# Patient Record
Sex: Female | Born: 1990 | Race: White | Hispanic: No | Marital: Married | Smoking: Never smoker
Health system: Southern US, Community
[De-identification: ages and names within clinical notes are randomized; demographics above are authoritative.]

## PROBLEM LIST (undated history)

## (undated) ENCOUNTER — Inpatient Hospital Stay (HOSPITAL_COMMUNITY): Payer: Self-pay

## (undated) DIAGNOSIS — M549 Dorsalgia, unspecified: Secondary | ICD-10-CM

## (undated) HISTORY — DX: Dorsalgia, unspecified: M54.9

---

## 2012-01-23 ENCOUNTER — Encounter (HOSPITAL_COMMUNITY): Payer: Self-pay | Admitting: *Deleted

## 2012-01-23 ENCOUNTER — Inpatient Hospital Stay (HOSPITAL_COMMUNITY)
Admission: AD | Admit: 2012-01-23 | Discharge: 2012-01-24 | Disposition: A | Discharge status: Home / Self Care | Payer: Medicaid Other | Source: Ambulatory Visit | Attending: Obstetrics & Gynecology | Admitting: Obstetrics & Gynecology

## 2012-01-23 DIAGNOSIS — M545 Low back pain, unspecified: Secondary | ICD-10-CM | POA: Insufficient documentation

## 2012-01-23 DIAGNOSIS — N949 Unspecified condition associated with female genital organs and menstrual cycle: Secondary | ICD-10-CM

## 2012-01-23 DIAGNOSIS — R109 Unspecified abdominal pain: Secondary | ICD-10-CM | POA: Insufficient documentation

## 2012-01-23 DIAGNOSIS — Z3490 Encounter for supervision of normal pregnancy, unspecified, unspecified trimester: Secondary | ICD-10-CM

## 2012-01-23 DIAGNOSIS — O368190 Decreased fetal movements, unspecified trimester, not applicable or unspecified: Secondary | ICD-10-CM | POA: Insufficient documentation

## 2012-01-23 DIAGNOSIS — O99891 Other specified diseases and conditions complicating pregnancy: Secondary | ICD-10-CM | POA: Insufficient documentation

## 2012-01-23 LAB — URINALYSIS, ROUTINE W REFLEX MICROSCOPIC
Bilirubin Urine: NEGATIVE
Glucose, UA: NEGATIVE mg/dL
Hgb urine dipstick: NEGATIVE
Ketones, ur: NEGATIVE mg/dL
Leukocytes, UA: NEGATIVE
Nitrite: NEGATIVE
Protein, ur: NEGATIVE mg/dL
Specific Gravity, Urine: <1.005 — ABNORMAL LOW (ref 1.005–1.030)
Urobilinogen, UA: 0.2 mg/dL (ref 0.0–1.0)
pH: 6 (ref 5.0–8.0)

## 2012-01-23 LAB — WET PREP, GENITAL
Clue Cells Wet Prep HPF POC: NONE SEEN
Trich, Wet Prep: NONE SEEN
WBC, Wet Prep HPF POC: NONE SEEN
Yeast Wet Prep HPF POC: NONE SEEN

## 2012-01-23 NOTE — MAU Note (Signed)
Pt LMP in July, no prenatal care, waiting for medicaid.  Last week had severe cramping and back pain, no bleeding.  Now abdomen very tender.

## 2012-01-23 NOTE — MAU Provider Note (Signed)
History     CSN: [id number]  Arrival date and time: 01/23/12 2058   None     Chief Complaint  Patient presents with  . Abdominal Pain   HPIHannah Espinoza is 21 y.o. G1P0 [redacted]w[redacted]d weeks presenting with 1 week history with strong lower back pain that wrapped around her lower abdomen.  She states she is exhausted and hasn't felt the baby move in 1 week.  Denies vaginal bleeding.  Has a yellow discharge.  Waiting for medicaid paper work to begin prenatal care.    History reviewed. No pertinent past medical history.  History reviewed. No pertinent past surgical history.  Family History  Problem Relation Age of Onset  . Hypertension Father     History  Substance Use Topics  . Smoking status: Never Smoker   . Smokeless tobacco: Never Used  . Alcohol Use: No    Allergies: No Known Allergies  Prescriptions prior to admission  Medication Sig Dispense Refill  . Prenatal Vit-Fe Fumarate-FA (PRENATAL MULTIVITAMIN) TABS Take by mouth.        Review of Systems  Gastrointestinal: Positive for abdominal pain (lower on her sides).  Genitourinary: Negative for dysuria and urgency.       Denies vaginal bleeding. + for vaginal discharge  Musculoskeletal: Positive for back pain (lower).   Physical Exam   Blood pressure 121/75, pulse 87, temperature 98 F (36.7 C), temperature source Oral, resp. rate 16, height 5\' 4"  (1.626 m), weight 130 lb 9.6 oz (59.24 kg), last menstrual period 09/03/2011.  Physical Exam  Constitutional: She is oriented to person, place, and time. She appears well-developed and well-nourished. No distress.  HENT:  Head: Normocephalic.  Neck: Normal range of motion.  Cardiovascular: Normal rate.   Respiratory: Effort normal.  GI: Soft. She exhibits no distension and no mass. There is tenderness (tenderness in the area of the round ligaments). There is no rebound and no guarding.  Genitourinary: There is no tenderness or lesion on the right labia. There is no  tenderness or lesion on the left labia. Uterus is enlarged (17 weeks size). Uterus is not tender. Cervix exhibits no motion tenderness, no discharge and no friability. No bleeding around the vagina. Vaginal discharge (thin discharge with mild odor) found.  Neurological: She is oriented to person, place, and time.  Skin: Skin is warm and dry.  Psychiatric: She has a normal mood and affect. Her behavior is normal.   Results for orders placed during the hospital encounter of 01/23/12 (from the past 24 hour(s))  URINALYSIS, ROUTINE W REFLEX MICROSCOPIC     Status: Abnormal   Collection Time   01/23/12  9:40 PM      Component Value Range   Color, Urine YELLOW  YELLOW   APPearance CLEAR  CLEAR   Specific Gravity, Urine <1.005 (*) 1.005 - 1.030   pH 6.0  5.0 - 8.0   Glucose, UA NEGATIVE  NEGATIVE mg/dL   Hgb urine dipstick NEGATIVE  NEGATIVE   Bilirubin Urine NEGATIVE  NEGATIVE   Ketones, ur NEGATIVE  NEGATIVE mg/dL   Protein, ur NEGATIVE  NEGATIVE mg/dL   Urobilinogen, UA 0.2  0.0 - 1.0 mg/dL   Nitrite NEGATIVE  NEGATIVE   Leukocytes, UA NEGATIVE  NEGATIVE  WET PREP, GENITAL     Status: Normal   Collection Time   01/23/12 11:30 PM      Component Value Range   Yeast Wet Prep HPF POC NONE SEEN  NONE SEEN   Trich, Wet Prep  NONE SEEN  NONE SEEN   Clue Cells Wet Prep HPF POC NONE SEEN  NONE SEEN   WBC, Wet Prep HPF POC NONE SEEN  NONE SEEN   MAU Course  Procedures   GC/CHL culture to lab  MDM   Assessment and Plan  A:  Lower back and abdominal pain at 17 weeks pregnancy       + FHR of 151 by Doppler       Round ligament pain  P:  Begin prenatal care with doctor of your choice     May take tylenol for discomfort       KEY,EVE M 01/23/2012, 11:20 PM

## 2012-01-24 LAB — GC/CHLAMYDIA PROBE AMP: GC Probe RNA: NEGATIVE

## 2012-02-21 ENCOUNTER — Ambulatory Visit (INDEPENDENT_AMBULATORY_CARE_PROVIDER_SITE_OTHER): Payer: Medicaid Other | Admitting: Obstetrics and Gynecology

## 2012-02-21 DIAGNOSIS — Z331 Pregnant state, incidental: Secondary | ICD-10-CM

## 2012-02-21 DIAGNOSIS — Z3689 Encounter for other specified antenatal screening: Secondary | ICD-10-CM

## 2012-02-21 LAB — POCT URINALYSIS DIPSTICK
Bilirubin, UA: NEGATIVE
Blood, UA: NEGATIVE
Glucose, UA: NEGATIVE
Ketones, UA: NEGATIVE
Spec Grav, UA: 1.015
pH, UA: 6

## 2012-02-21 NOTE — Progress Notes (Signed)
NOB interview completed.  Pt 24w 3d by LMP, but pt states that she went to [facility] 01/23/12 and was told was 17 w at the time, but no US done.  Pt has not had any previous prenatl care.  Pt scheduled for [place] for an anatomy and NOB work up on Friday 03/02/12, will arrive @ 1400 for [place], will have NOB work up after [place].

## 2012-02-22 LAB — CULTURE, OB URINE

## 2012-02-22 NOTE — L&D Delivery Note (Signed)
Delivery Note At 3:25 AM a viable female, "Mary Espinoza", was delivered via Vaginal, Spontaneous Delivery (Presentation: Left Occiput Anterior).  APGAR: 9, 9; weight .   Placenta status: Intact, Spontaneous.  Cord: 3 vessels with the following complications: Short cord.  Cord pH: NA Cord blood collection done for CCBB.  Anesthesia: Epidural  Episiotomy: None Lacerations: 2nd degree perineal; 1st degree right periurethral--no repair required. Suture Repair: 3.0 vicryl Est. Blood Loss (mL): 300 cc  Mom to postpartum.  Baby to skin to skin.Marland Kitchen  Nigel Bridgeman 07/13/2012, 4:16 AM

## 2012-02-23 LAB — PRENATAL PANEL VII
Basophils Relative: 0 % (ref 0–1)
HCT: 32.6 % — ABNORMAL LOW (ref 36.0–46.0)
HIV: NONREACTIVE
Hemoglobin: 11.4 g/dL — ABNORMAL LOW (ref 12.0–15.0)
Hepatitis B Surface Ag: NEGATIVE
Lymphs Abs: 1.4 10*3/uL (ref 0.7–4.0)
MCHC: 35 g/dL (ref 30.0–36.0)
Monocytes Absolute: 0.6 10*3/uL (ref 0.1–1.0)
Monocytes Relative: 6 % (ref 3–12)
Neutro Abs: 7.5 10*3/uL (ref 1.7–7.7)
RBC: 3.6 MIL/uL — ABNORMAL LOW (ref 3.87–5.11)
Rh Type: POSITIVE
Rubella: 2.8 Index — ABNORMAL HIGH (ref <0.90)

## 2012-03-02 ENCOUNTER — Ambulatory Visit: Payer: Medicaid Other | Admitting: *Deleted

## 2012-03-02 ENCOUNTER — Encounter: Payer: Self-pay | Admitting: *Deleted

## 2012-03-02 ENCOUNTER — Ambulatory Visit (INDEPENDENT_AMBULATORY_CARE_PROVIDER_SITE_OTHER): Payer: Medicaid Other

## 2012-03-02 VITALS — BP 100/60 | Wt 141.0 lb

## 2012-03-02 DIAGNOSIS — O365990 Maternal care for other known or suspected poor fetal growth, unspecified trimester, not applicable or unspecified: Secondary | ICD-10-CM

## 2012-03-02 DIAGNOSIS — Z331 Pregnant state, incidental: Secondary | ICD-10-CM

## 2012-03-02 DIAGNOSIS — O98819 Other maternal infectious and parasitic diseases complicating pregnancy, unspecified trimester: Secondary | ICD-10-CM

## 2012-03-02 DIAGNOSIS — Z3689 Encounter for other specified antenatal screening: Secondary | ICD-10-CM

## 2012-03-02 DIAGNOSIS — O0930 Supervision of pregnancy with insufficient antenatal care, unspecified trimester: Secondary | ICD-10-CM

## 2012-03-02 DIAGNOSIS — B951 Streptococcus, group B, as the cause of diseases classified elsewhere: Secondary | ICD-10-CM | POA: Insufficient documentation

## 2012-03-02 LAB — US OB COMP + 14 WK

## 2012-03-02 NOTE — Progress Notes (Signed)
Subjective:    Mary Espinoza is being seen today for her first obstetrical visit at [redacted]w[redacted]d gestation by [place]. She is 22 y.o.caucasian, relationship w FOB She is married and became pregnant on her honeymoon, works full-time as a Careers adviser. Her husband is a Company secretary.  She denies any vag bleeding, cramping, or discharge.  She denies nausea/vomiting.   Her obstetrical history is significant for: Patient Active Problem List  Diagnosis  . Group B streptococcal infection in pregnancy  . Late prenatal care      Pregnancy history fully reviewed.  The following portions of the patient's history were reviewed and updated as appropriate: allergies, current medications, past family history and past medical history.  Review of Systems Pertinent ROS is described in HPI   Objective:   BP 100/60  Wt 141 lb (63.957 kg)  LMP 09/03/2011 Wt Readings from Last 1 Encounters:  03/02/12 141 lb (63.957 kg)   BMI: There is no height on file to calculate BMI.  General: alert, cooperative and no distress HEENT: grossly normal  Thyroid: normal  Respiratory: clear to auscultation bilaterally Cardiovascular: regular rate and rhythm,  Breasts:  Not examined Gastrointestinal: soft, non-tender; no masses,  no organomegaly Extremities: extremities normal, no pain or edema   EXTERNAL GENITALIA: normal appearing vulva with no masses, tenderness or lesions VAGINA: no abnormal discharge, bleeding or lesions CERVIX: no lesions or cervical motion tenderness; cervix closed, long, firm UTERUS: gravid and consistent with 23 weeks ADNEXA: no masses palpable and nontender OB EXAM PELVIMETRY: appears adequate, Will  Be done at next visit  FHR:  137 bpm    Assessment:    Pregnancy at 23 wks   Plan:  Routine prenatal care.   Prenatal labs rv'd  Urine cx Group Beta Strep -treat now and repeat culture in 3rd trimester. Return in 4 weeks for f/up US for interval growth.   Blood type O positive   Pap smear collected:  no GC/Chlamydia collected:  no Wet prep: not completed Discussion of Genetic testing options:Desires no genetic testing Problem list reviewed and updated. 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

## 2012-03-02 NOTE — Progress Notes (Signed)
[redacted]w[redacted]d Pt declines genetic screening. Pap due today Pt declines flu vaccine. Ultrasound shows:  EDD: 06/29/12           AFI: fluid is normal (vertical pocket = 5.4 cm)           Cervical length: 3.34 cm           Placenta localization: anterior (placental edge is 6.0 cm from internal OS-normal)           Fetal presentation: vertex                   Anatomy survey is normal           Gender : female No anomalies seen, Normal ovaries, No fluid in CDS, [place], suggest EDD changed to 06/29/12 by U/S   Amoxicillin 500 mg caps po bid x 7 days was ordered 03/06/12 to treat GBS found in urine cx on 02/21/12. Will repeat culture during 3rd trimester. Pt is aware.

## 2012-03-06 MED ORDER — AMOXICILLIN 500 MG PO CAPS: 500.0000 mg | ORAL_CAPSULE | Freq: Two times a day (BID) | ORAL | Status: AC

## 2012-03-06 NOTE — Progress Notes (Signed)
Results of ultrasound most consistent with appropriately grown infant with uncertain dates.  Will therefore use U/S for dating and followup U/S in 4 weeks to document adequate interval growth.

## 2012-03-08 ENCOUNTER — Telehealth: Payer: Self-pay

## 2012-03-08 NOTE — Telephone Encounter (Signed)
Spoke with pt informing her Amoxicillin 500 mg caps po bid x 7 days was ordered 03/06/12 & sent to [facility] on [address] to treat GBS found in urine cx on 02/21/12. Pt agrees and voices understanding.

## 2012-03-20 ENCOUNTER — Ambulatory Visit: Payer: Medicaid Other | Admitting: Obstetrics and Gynecology

## 2012-03-20 ENCOUNTER — Encounter: Payer: Self-pay | Admitting: Obstetrics and Gynecology

## 2012-03-20 VITALS — BP 100/56 | Wt 146.0 lb

## 2012-03-20 DIAGNOSIS — Z331 Pregnant state, incidental: Secondary | ICD-10-CM

## 2012-03-20 NOTE — Progress Notes (Signed)
[redacted]w[redacted]d  GFM

## 2012-03-20 NOTE — Progress Notes (Signed)
[redacted]w[redacted]d Pt has no complaints  Pt declines flu vaccine

## 2012-03-30 ENCOUNTER — Ambulatory Visit: Payer: Medicaid Other | Admitting: Obstetrics and Gynecology

## 2012-03-30 ENCOUNTER — Ambulatory Visit: Payer: Medicaid Other

## 2012-03-30 ENCOUNTER — Encounter: Payer: Self-pay | Admitting: Obstetrics and Gynecology

## 2012-03-30 VITALS — BP 98/60 | Wt 147.0 lb

## 2012-03-30 DIAGNOSIS — O365990 Maternal care for other known or suspected poor fetal growth, unspecified trimester, not applicable or unspecified: Secondary | ICD-10-CM

## 2012-03-30 DIAGNOSIS — Z331 Pregnant state, incidental: Secondary | ICD-10-CM

## 2012-03-30 NOTE — Progress Notes (Signed)
Pt w/o complaint.  

## 2012-03-30 NOTE — Progress Notes (Signed)
[redacted]w[redacted]d No complaints today.  Reports good fetal movement. U/S: EFW 2-6, 52.8%, Cervix 3.42 cm, AFI 19.7, vertex with anterior placenta.  Size =to dates.   Glucola at next visit.

## 2012-04-11 ENCOUNTER — Other Ambulatory Visit: Payer: Medicaid Other

## 2012-04-11 ENCOUNTER — Encounter: Payer: Self-pay | Admitting: Obstetrics and Gynecology

## 2012-04-11 ENCOUNTER — Ambulatory Visit: Payer: Medicaid Other | Admitting: Obstetrics and Gynecology

## 2012-04-11 ENCOUNTER — Encounter: Payer: Medicaid Other | Admitting: Obstetrics and Gynecology

## 2012-04-11 VITALS — BP 104/58 | Wt 149.0 lb

## 2012-04-11 DIAGNOSIS — Z331 Pregnant state, incidental: Secondary | ICD-10-CM

## 2012-04-11 LAB — CBC
HCT: 32.8 % — ABNORMAL LOW (ref 36.0–46.0)
MCV: 89.1 fL (ref 78.0–100.0)
Platelets: 254 10*3/uL (ref 150–400)
RBC: 3.68 MIL/uL — ABNORMAL LOW (ref 3.87–5.11)
RDW: 13.2 % (ref 11.5–15.5)
WBC: 8.4 10*3/uL (ref 4.0–10.5)

## 2012-04-11 NOTE — Progress Notes (Signed)
[redacted]w[redacted]d Glucola today  Pt has no complaints

## 2012-04-11 NOTE — Progress Notes (Signed)
[redacted]w[redacted]d Glucola today

## 2012-04-12 LAB — US OB FOLLOW UP

## 2012-04-12 LAB — GLUCOSE TOLERANCE, 1 HOUR (50G) W/O FASTING: Glucose, 1 Hour GTT: 76 mg/dL (ref 70–140)

## 2012-04-16 ENCOUNTER — Other Ambulatory Visit: Payer: Medicaid Other

## 2012-04-25 ENCOUNTER — Encounter: Payer: Self-pay | Admitting: Certified Nurse Midwife

## 2012-04-25 ENCOUNTER — Ambulatory Visit: Payer: Medicaid Other | Admitting: Certified Nurse Midwife

## 2012-04-25 VITALS — BP 120/80 | Wt 152.0 lb

## 2012-04-25 NOTE — Progress Notes (Signed)
[redacted]w[redacted]d No complaints

## 2012-04-25 NOTE — Progress Notes (Signed)
No complaints GBS in urine reviewed-RX in labor, Rh positive Will need Rx. In labor Positive FM seen Reg. FHT's 150's Brief review of 3rd trimester pg. Needs rountine UC&S next vs S & S ptl reviewed Increase Fe in diet 2 weeks to follow up ROB Mary Espinoza, CNM, FNP

## 2012-05-09 ENCOUNTER — Ambulatory Visit: Payer: Medicaid Other | Admitting: Family Medicine

## 2012-05-09 VITALS — BP 110/64 | Wt 156.0 lb

## 2012-05-09 DIAGNOSIS — Z331 Pregnant state, incidental: Secondary | ICD-10-CM

## 2012-05-09 NOTE — Progress Notes (Signed)
[redacted]w[redacted]d Doing well, good fetal movement. No complaints today. ROB in 2 weeks. L.Rama Sorci, FNP-BC

## 2012-05-09 NOTE — Progress Notes (Signed)
[redacted]w[redacted]d No complaints today.

## 2012-05-13 ENCOUNTER — Encounter: Payer: Self-pay | Admitting: Certified Nurse Midwife

## 2012-07-12 ENCOUNTER — Inpatient Hospital Stay (HOSPITAL_COMMUNITY)
Admission: AD | Admit: 2012-07-12 | Discharge: 2012-07-14 | DRG: 775 | Disposition: A | Discharge status: Home / Self Care | Payer: Medicaid Other | Source: Ambulatory Visit | Attending: Obstetrics and Gynecology | Admitting: Obstetrics and Gynecology

## 2012-07-12 ENCOUNTER — Encounter (HOSPITAL_COMMUNITY): Payer: Self-pay | Admitting: *Deleted

## 2012-07-12 ENCOUNTER — Inpatient Hospital Stay (HOSPITAL_COMMUNITY): Payer: Medicaid Other | Admitting: Anesthesiology

## 2012-07-12 ENCOUNTER — Encounter (HOSPITAL_COMMUNITY): Payer: Self-pay | Admitting: Anesthesiology

## 2012-07-12 DIAGNOSIS — O9903 Anemia complicating the puerperium: Secondary | ICD-10-CM | POA: Diagnosis not present

## 2012-07-12 DIAGNOSIS — IMO0001 Reserved for inherently not codable concepts without codable children: Secondary | ICD-10-CM

## 2012-07-12 DIAGNOSIS — O480 Post-term pregnancy: Principal | ICD-10-CM | POA: Diagnosis present

## 2012-07-12 DIAGNOSIS — D6489 Other specified anemias: Secondary | ICD-10-CM | POA: Diagnosis not present

## 2012-07-12 DIAGNOSIS — O98819 Other maternal infectious and parasitic diseases complicating pregnancy, unspecified trimester: Secondary | ICD-10-CM

## 2012-07-12 DIAGNOSIS — O3660X0 Maternal care for excessive fetal growth, unspecified trimester, not applicable or unspecified: Secondary | ICD-10-CM | POA: Diagnosis present

## 2012-07-12 DIAGNOSIS — Z22330 Carrier of Group B streptococcus: Secondary | ICD-10-CM

## 2012-07-12 DIAGNOSIS — O99892 Other specified diseases and conditions complicating childbirth: Secondary | ICD-10-CM | POA: Diagnosis present

## 2012-07-12 LAB — ABO/RH: ABO/RH(D): O POS

## 2012-07-12 LAB — CBC
MCH: 27.9 pg (ref 26.0–34.0)
MCHC: 32.9 g/dL (ref 30.0–36.0)
Platelets: 240 10*3/uL (ref 150–400)
RDW: 13.1 % (ref 11.5–15.5)

## 2012-07-12 LAB — RPR: RPR Ser Ql: NONREACTIVE

## 2012-07-12 MED ORDER — PHENYLEPHRINE 40 MCG/ML (10ML) SYRINGE FOR IV PUSH (FOR BLOOD PRESSURE SUPPORT)
80.0000 ug | PREFILLED_SYRINGE | INTRAVENOUS | Status: DC | PRN
  Filled 2012-07-12: qty 2
  Filled 2012-07-12: qty 5

## 2012-07-12 MED ORDER — PENICILLIN G POTASSIUM 5000000 UNITS IJ SOLR
2.5000 10*6.[IU] | INTRAVENOUS | Status: DC
  Administered 2012-07-12 (×3): 2.5 10*6.[IU] via INTRAVENOUS
  Filled 2012-07-12 (×8): qty 2.5

## 2012-07-12 MED ORDER — OXYCODONE-ACETAMINOPHEN 5-325 MG PO TABS: 1.0000 | ORAL_TABLET | ORAL | Status: DC | PRN

## 2012-07-12 MED ORDER — LIDOCAINE HCL (PF) 1 % IJ SOLN
30.0000 mL | INTRAMUSCULAR | Status: DC | PRN
  Administered 2012-07-13: 30 mL via SUBCUTANEOUS
  Filled 2012-07-12 (×2): qty 30

## 2012-07-12 MED ORDER — FENTANYL CITRATE 0.05 MG/ML IJ SOLN
100.0000 ug | INTRAMUSCULAR | Status: DC | PRN
  Administered 2012-07-12: 100 ug via INTRAVENOUS
  Filled 2012-07-12: qty 2

## 2012-07-12 MED ORDER — EPHEDRINE 5 MG/ML INJ
10.0000 mg | INTRAVENOUS | Status: DC | PRN
  Administered 2012-07-12: 10 mg via INTRAVENOUS
  Filled 2012-07-12: qty 2

## 2012-07-12 MED ORDER — ONDANSETRON HCL 4 MG/2ML IJ SOLN: 4.0000 mg | Freq: Four times a day (QID) | INTRAMUSCULAR | Status: DC | PRN

## 2012-07-12 MED ORDER — PENICILLIN G POTASSIUM 5000000 UNITS IJ SOLR
5.0000 10*6.[IU] | Freq: Once | INTRAVENOUS | Status: AC
  Administered 2012-07-12: 5 10*6.[IU] via INTRAVENOUS
  Filled 2012-07-12: qty 5

## 2012-07-12 MED ORDER — LACTATED RINGERS IV SOLN
500.0000 mL | INTRAVENOUS | Status: DC | PRN
  Administered 2012-07-12: 500 mL via INTRAVENOUS

## 2012-07-12 MED ORDER — LACTATED RINGERS IV SOLN
INTRAVENOUS | Status: DC
  Administered 2012-07-12 – 2012-07-13 (×2): via INTRAUTERINE

## 2012-07-12 MED ORDER — PHENYLEPHRINE 40 MCG/ML (10ML) SYRINGE FOR IV PUSH (FOR BLOOD PRESSURE SUPPORT)
80.0000 ug | PREFILLED_SYRINGE | INTRAVENOUS | Status: DC | PRN
  Filled 2012-07-12: qty 2

## 2012-07-12 MED ORDER — OXYTOCIN 40 UNITS IN LACTATED RINGERS INFUSION - SIMPLE MED: 62.5000 mL/h | INTRAVENOUS | Status: DC

## 2012-07-12 MED ORDER — AMPICILLIN-SULBACTAM SODIUM 3 (2-1) G IJ SOLR
3.0000 g | Freq: Four times a day (QID) | INTRAMUSCULAR | Status: DC
  Administered 2012-07-12: 3 g via INTRAVENOUS
  Filled 2012-07-12 (×4): qty 3

## 2012-07-12 MED ORDER — IBUPROFEN 600 MG PO TABS: 600.0000 mg | ORAL_TABLET | Freq: Four times a day (QID) | ORAL | Status: DC | PRN

## 2012-07-12 MED ORDER — LACTATED RINGERS IV SOLN: INTRAVENOUS | Status: DC

## 2012-07-12 MED ORDER — CITRIC ACID-SODIUM CITRATE 334-500 MG/5ML PO SOLN
30.0000 mL | ORAL | Status: DC | PRN
  Administered 2012-07-13: 30 mL via ORAL
  Filled 2012-07-12: qty 15

## 2012-07-12 MED ORDER — ACETAMINOPHEN 325 MG PO TABS: 650.0000 mg | ORAL_TABLET | ORAL | Status: DC | PRN

## 2012-07-12 MED ORDER — FENTANYL 2.5 MCG/ML BUPIVACAINE 1/10 % EPIDURAL INFUSION (WH - ANES)
14.0000 mL/h | INTRAMUSCULAR | Status: DC | PRN
  Administered 2012-07-12 – 2012-07-13 (×2): 14 mL/h via EPIDURAL
  Filled 2012-07-12 (×2): qty 125

## 2012-07-12 MED ORDER — LACTATED RINGERS IV SOLN
500.0000 mL | Freq: Once | INTRAVENOUS | Status: AC
  Administered 2012-07-12: 500 mL via INTRAVENOUS

## 2012-07-12 MED ORDER — DIPHENHYDRAMINE HCL 50 MG/ML IJ SOLN: 12.5000 mg | INTRAMUSCULAR | Status: DC | PRN

## 2012-07-12 MED ORDER — ACETAMINOPHEN 650 MG RE SUPP
650.0000 mg | RECTAL | Status: DC | PRN
  Administered 2012-07-12: 650 mg via RECTAL
  Filled 2012-07-12: qty 1

## 2012-07-12 MED ORDER — OXYTOCIN 40 UNITS IN LACTATED RINGERS INFUSION - SIMPLE MED
1.0000 m[IU]/min | INTRAVENOUS | Status: DC
  Administered 2012-07-12: 1 m[IU]/min via INTRAVENOUS
  Filled 2012-07-12: qty 1000

## 2012-07-12 MED ORDER — SODIUM BICARBONATE 8.4 % IV SOLN
INTRAVENOUS | Status: DC | PRN
  Administered 2012-07-12: 5 mL via EPIDURAL

## 2012-07-12 MED ORDER — TERBUTALINE SULFATE 1 MG/ML IJ SOLN: 0.2500 mg | Freq: Once | INTRAMUSCULAR | Status: AC | PRN

## 2012-07-12 MED ORDER — EPHEDRINE 5 MG/ML INJ
10.0000 mg | INTRAVENOUS | Status: DC | PRN
  Filled 2012-07-12: qty 2
  Filled 2012-07-12: qty 4

## 2012-07-12 MED ORDER — OXYTOCIN BOLUS FROM INFUSION: 500.0000 mL | INTRAVENOUS | Status: DC

## 2012-07-12 NOTE — H&P (Signed)
Mary Espinoza is a 22 y.o. female, G1P0000 at [redacted]w[redacted]d, presenting for irregular contractions and post dates.  Pt has been having UCs on and off for a couple of days.  Denies VB, LOF, recent fever, resp or GI c/o's, UTI or PIH s/s . GFM. Desires natural childbirth and request to avoid pitocin if possible.  Patient Active Problem List   Diagnosis Date Noted  . Group B streptococcal infection in pregnancy 03/02/2012  . Late prenatal care 03/02/2012   History of present pregnancy: Patient entered care at [redacted]w[redacted]d.   EDC of 06/29/12 was established by [redacted]w[redacted]d U/S.   Anatomy scan:  [redacted]w[redacted]d weeks, with normal findings and an anterior placenta.  EDD changed to [redacted]w[redacted]d. Additional [place] evaluations:  @ [redacted]w[redacted]d EFW 9lb5oz > 90th%ile; 4242g +/- 454g.   Significant prenatal events:  Late entry to care   Last evaluation:  07/09/12 at [redacted]w[redacted]d - declined cx exam, declined IOL, BPP 8/8  OB History   Grav Para Term Preterm Abortions TAB SAB Ect Mult Living   1              Past Medical History  Diagnosis Date  . Back pain     due to spinal issues;has seen chiropractor   No past surgical history on file.  Family History: family history includes Arthritis in her maternal grandmother and paternal grandmother; Brain cancer in her paternal uncle; Diabetes in her paternal uncle; Heart disease in her maternal grandfather; Hypertension in her father; Other in her mother; Pancreatitis in her paternal uncle; and Renal cancer in her paternal grandfather.  Social History:  reports that she has never smoked. She has never used smokeless tobacco. She reports that she does not drink alcohol or use illicit drugs.   Prenatal Transfer Tool  Maternal Diabetes: No Genetic Screening: Declined Maternal Ultrasounds/Referrals: Abnormal:  Findings:   Other:EFW >90th%ile at [redacted]w[redacted]d  Fetal Ultrasounds or other Referrals:  None Maternal Substance Abuse:  No Significant Maternal Medications:  None Significant Maternal Lab Results: Lab values  include: Group B Strep positive    ROS:  See HPI above, all other systems are negative  No Known Allergies   Dilation: 1 Effacement (%): 70 Station: -1 Exam by:: Mary Espinoza,CNM  Blood pressure 146/82, pulse 88, temperature 98.8 F (37.1 C), temperature source Oral, resp. rate 20, height 5\' 4"  (1.626 m), weight 168 lb (76.204 kg), last menstrual period 09/03/2011.  Chest clear Heart RRR without murmur Abd gravid, NT Pelvic: see above, no VB noted Ext: WNL  FHR: Cat I UCs:  Q 2-5 min  Prenatal labs: ABO, Rh: O/POS/-- (12/31 1112) Antibody: NEG (12/31 1112) Rubella:   Immune RPR: NON REAC (02/19 1013)  HBsAg: NEGATIVE (12/31 1112)  HIV: NON REACTIVE (12/31 1112)  GBS:  Pos by urine Sickle cell/Hgb electrophoresis:  n/a Pap:  Not done during prenatal period GC:  neg Chlamydia:  neg Genetic screenings:  Declines Glucola:  76 Other:  n/a    Assessment/Plan: IUP at [redacted]w[redacted]d GBS positive EFW 9lb5oz >90th%ile; 4242g +/- 454g @ [redacted]w[redacted]d Desires to avoid IOL Discussed risk associated with post-date pregnancy - pt declines IOL at this time but we agreed to allow her time to discuss with family and re-evaluate in a couple of hours.  Discussed risk of vaginal delivery of LGA baby and c/s was offered - pt declined at this time  Admitted to [facility] per consult with Dr. Estanislado Pandy as attending MD Routine [facility] admit orders at this time    Mary Espinoza,  JENNIFERCNM, MSN 07/12/2012, 8:08 AM

## 2012-07-12 NOTE — Progress Notes (Signed)
  Subjective: Comfortable with epidural.  Resting on right side.  Objective: BP 111/61  Pulse 99  Temp(Src) 98.1 F (36.7 C) (Oral)  Resp 18  Ht 5\' 4"  (1.626 m)  Wt 168 lb (76.204 kg)  BMI 28.82 kg/m2  SpO2 100%  LMP 09/03/2011      FHT:  Category 1 UC:   regular, every 3 minutes SVE:   Dilation: 7 Effacement (%): 100 Station: 0 Exam by:: Manfred Arch, CNM Pelvis feels adequate IUPC placed  Assessment / Plan: Progressive labor Possible LGA infant MSF Will CTO at present--will observe quality of contractions and progression of labor.  Mary Espinoza 07/12/2012, 7:44 PM

## 2012-07-12 NOTE — Progress Notes (Signed)
  Subjective: Comfortable with epidural, slight hot spot on right.  Objective: BP 120/54  Pulse 101  Temp(Src) 100.9 F (38.3 C) (Axillary)  Resp 18  Ht 5\' 4"  (1.626 m)  Wt 168 lb (76.204 kg)  BMI 28.82 kg/m2  SpO2 100%  LMP 09/03/2011    Gave Tylenol suppository at 10:22p due to temp of 100.9 at 9:44p, with fluid bolus. Consulted with Dr. Luna Glasgow start Unasyn 3 gm IV q 6 hours now.  FHT:  Category 1, baseline 150s, with occ mild variables. UC:   q 3-4 min, MVUs 150 SVE:   Dilation: Lip/rim Effacement (%): 100 Station: 0;+1 Exam by:: Manfred Arch, CNM Slight rim on left side of cervix.  Assessment / Plan: Progressive labor Elevated temp Recommended pitocin augmentation to facilitate dilation and descent--patient agreeable with plan. Close observation of FHR   Mary Espinoza 07/12/2012, 11:16 PM

## 2012-07-12 NOTE — Progress Notes (Signed)
  Subjective: Pt on hands and knees in bed leaning over ball.  Pt rocking and breathing through UCs.  Pt reports UCs are closer and stronger.  Foley bulb fell out recently while pt was in the shower.  Objective: BP 117/67  Pulse 106  Temp(Src) 97.8 F (36.6 C) (Oral)  Resp 18  Ht 5\' 4"  (1.626 m)  Wt 168 lb (76.204 kg)  BMI 28.82 kg/m2  LMP 09/03/2011     FHT:  Cat I UC:   regular, every 2-3 minutes  SVE:   Dilation: 3 Effacement (%): 80 Station: -1 Exam by:: Annamary Rummage, CNM  Assessment / Plan:  Foley bulb fell out  Labor: ? Active labor; will check in a couple of hours to determine Preeclampsia: no s/s Fetal Wellbeing: Cat I Pain Control: Relaxation, breathing, shower I/D: GBS prophylaxis Anticipated MOD: SVD   Mary Espinoza 07/12/2012, 3:20 PM

## 2012-07-12 NOTE — Progress Notes (Signed)
  Subjective: Sleeping on right side.  Objective: BP 99/44  Pulse 90  Temp(Src) 98.1 F (36.7 C) (Oral)  Resp 18  Ht 5\' 4"  (1.626 m)  Wt 168 lb (76.204 kg)  BMI 28.82 kg/m2  SpO2 100%  LMP 09/03/2011      Filed Vitals:   07/12/12 1831 07/12/12 1901 07/12/12 1931 07/12/12 2002  BP: 119/68 111/61 116/57 99/44  Pulse: 108 99 96 90  Temp:      TempSrc:      Resp: 18 18 18 18   Height:      Weight:      SpO2:         FHT:  Series of early decels, mild variables--moderate variability continues UC:   q 3-5 min, moderate SVE:   Dilation: 8 Effacement (%): 100 Station: 0;+1 Exam by:: Manfred Arch, CNM MVUs 120-150, but labor progressing.  Assessment / Plan: Progressive labor Early decels Will initiate amnioinfusion Ephedrine prn for hypotension  Nigel Bridgeman 07/12/2012, 8:34 PM

## 2012-07-12 NOTE — Anesthesia Preprocedure Evaluation (Signed)

## 2012-07-12 NOTE — Progress Notes (Signed)
  Subjective: Pt up standing and rocking with UCs.  Pt has spent a few min discussing with FOB regarding how to proceed with IOL.  Pt and FOB have decided to proceed with either AROM or Foley bulb.  After discussing pro and cons of both we all decided to try Foley bulb.    Objective: BP 112/74  Pulse 90  Temp(Src) 98.8 F (37.1 C) (Oral)  Resp 18  Ht 5\' 4"  (1.626 m)  Wt 168 lb (76.204 kg)  BMI 28.82 kg/m2  LMP 09/03/2011     FHT:  Cat I UC:   irregular  SVE:   Dilation: 1 Effacement (%): 70 Station: -1 Exam by:: Pocahontas Cohenour,CNM  Assessment / Plan:  Foley bulb placed, well tolerated SROM occurred during placement - mod amount meconium  Labor: Early labor, with uncoordinated UCs; postdates  Preeclampsia: One elevated BP - will CTO  Fetal Wellbeing: Cat I  Pain Control: Well managed with breathing and relaxation.  I/D: GBS prophylaxis protocol has been initiated  Anticipated MOD: SVD    Carmyn Hamm 07/12/2012, 10:46 AM

## 2012-07-12 NOTE — Progress Notes (Signed)
  Subjective: Pt requesting epidural at this time. Pt very uncomfortable during UCs.   Objective: BP 85/71  Pulse 126  Temp(Src) 98 F (36.7 C) (Oral)  Resp 18  Ht 5\' 4"  (1.626 m)  Wt 168 lb (76.204 kg)  BMI 28.82 kg/m2  LMP 09/03/2011    FHT:  Cat I UC:   regular, every 1-4 minutes  SVE:   Dilation: 5 Effacement (%): 100 Station: -1 Exam by:: Annamary Rummage, CNM  Assessment / Plan:  Labor: Active labor Preeclampsia: no s/s Fetal Wellbeing: Pain Control: Epidural requested I/D: GBS prophylaxis  Anticipated MOD: SVD   Tamyrah Burbage 07/12/2012, 4:56 PM

## 2012-07-12 NOTE — Anesthesia Procedure Notes (Signed)

## 2012-07-12 NOTE — Progress Notes (Signed)
  Subjective: Pt laying on left side.  Pt reports UCs are now stronger and closer together.  Pt having to breath and concentrate during UCs. Pt states she's going to try to nap and then maybe get in the shower soon.  Objective: BP 122/66  Pulse 75  Temp(Src) 98.1 F (36.7 C) (Oral)  Resp 18  Ht 5\' 4"  (1.626 m)  Wt 168 lb (76.204 kg)  BMI 28.82 kg/m2  LMP 09/03/2011     FHT:  Cat I UC:   regular, every 5-7 minutes  SVE:  Deferred  Assessment / Plan:  Foley bulb in place  Labor: Early Labor Preeclampsia: no s/s, will CTO BP after 1x elevated BP at 0730 Fetal Wellbeing: Cat I Pain Control: Relaxation and breathing I/D: GBS prophylaxis Anticipated MOD: SVD   Scotty Weigelt 07/12/2012, 1:39 PM

## 2012-07-12 NOTE — Progress Notes (Signed)
  Subjective: Pt coping well with occasional UCs.  Pt sitting on ball when entering room.  Pt is still unsure how she would like to proceed with IOL.    Objective: BP 112/74  Pulse 90  Temp(Src) 98.8 F (37.1 C) (Oral)  Resp 18  Ht 5\' 4"  (1.626 m)  Wt 168 lb (76.204 kg)  BMI 28.82 kg/m2  LMP 09/03/2011     FHT:  Cat I UC:   irregular, every 1 - 7 minutes  SVE:   Dilation: 1 Effacement (%): 70 Station: -1 Exam by:: Shaterrica Territo,CNM No change  Assessment / Plan:  Labor: Early labor, with uncoordinated UCs; postdates Preeclampsia: One elevated BP - will CTO Fetal Wellbeing: Cat I Pain Control: Well managed with breathing and relaxation. I/D: GBS prophylaxis protocol has been initiated Anticipated MOD: SVD   Charmaine Placido 07/12/2012, 10:05 AM

## 2012-07-13 ENCOUNTER — Encounter (HOSPITAL_COMMUNITY): Payer: Self-pay | Admitting: *Deleted

## 2012-07-13 MED ORDER — ONDANSETRON HCL 4 MG/2ML IJ SOLN: 4.0000 mg | INTRAMUSCULAR | Status: DC | PRN

## 2012-07-13 MED ORDER — LANOLIN HYDROUS EX OINT: TOPICAL_OINTMENT | CUTANEOUS | Status: DC | PRN

## 2012-07-13 MED ORDER — DIPHENHYDRAMINE HCL 25 MG PO CAPS: 25.0000 mg | ORAL_CAPSULE | Freq: Four times a day (QID) | ORAL | Status: DC | PRN

## 2012-07-13 MED ORDER — BENZOCAINE-MENTHOL 20-0.5 % EX AERO
1.0000 "application " | INHALATION_SPRAY | CUTANEOUS | Status: DC | PRN
  Administered 2012-07-14: 1 via TOPICAL
  Filled 2012-07-13 (×2): qty 56

## 2012-07-13 MED ORDER — SENNOSIDES-DOCUSATE SODIUM 8.6-50 MG PO TABS
2.0000 | ORAL_TABLET | Freq: Every day | ORAL | Status: DC
  Administered 2012-07-13: 2 via ORAL

## 2012-07-13 MED ORDER — WITCH HAZEL-GLYCERIN EX PADS: 1.0000 "application " | MEDICATED_PAD | CUTANEOUS | Status: DC | PRN

## 2012-07-13 MED ORDER — DIBUCAINE 1 % RE OINT: 1.0000 "application " | TOPICAL_OINTMENT | RECTAL | Status: DC | PRN

## 2012-07-13 MED ORDER — OXYCODONE-ACETAMINOPHEN 5-325 MG PO TABS: 1.0000 | ORAL_TABLET | ORAL | Status: DC | PRN

## 2012-07-13 MED ORDER — IBUPROFEN 600 MG PO TABS
600.0000 mg | ORAL_TABLET | Freq: Four times a day (QID) | ORAL | Status: DC
  Administered 2012-07-13 – 2012-07-14 (×6): 600 mg via ORAL
  Filled 2012-07-13 (×6): qty 1

## 2012-07-13 MED ORDER — PRENATAL MULTIVITAMIN CH
1.0000 | ORAL_TABLET | Freq: Every day | ORAL | Status: DC
  Administered 2012-07-13 – 2012-07-14 (×2): 1 via ORAL
  Filled 2012-07-13 (×2): qty 1

## 2012-07-13 MED ORDER — ONDANSETRON HCL 4 MG PO TABS: 4.0000 mg | ORAL_TABLET | ORAL | Status: DC | PRN

## 2012-07-13 MED ORDER — SIMETHICONE 80 MG PO CHEW: 80.0000 mg | CHEWABLE_TABLET | ORAL | Status: DC | PRN

## 2012-07-13 MED ORDER — TETANUS-DIPHTH-ACELL PERTUSSIS 5-2.5-18.5 LF-MCG/0.5 IM SUSP: 0.5000 mL | Freq: Once | INTRAMUSCULAR | Status: DC

## 2012-07-13 MED ORDER — ZOLPIDEM TARTRATE 5 MG PO TABS: 5.0000 mg | ORAL_TABLET | Freq: Every evening | ORAL | Status: DC | PRN

## 2012-07-13 NOTE — Progress Notes (Signed)
UR chart review completed.  

## 2012-07-13 NOTE — Progress Notes (Signed)
  Subjective: Comfortable--no urge to push.  Objective: BP 120/54  Pulse 101  Temp(Src) 100.9 F (38.3 C) (Axillary)  Resp 18  Ht 5\' 4"  (1.626 m)  Wt 168 lb (76.204 kg)  BMI 28.82 kg/m2  SpO2 100%  LMP 09/03/2011      FHT:  Category 1 UC:   regular, every 2-3 minutes SVE:  Complete, vtx +1/+2. MVUs 180 Pitocin on 3 mu/min  Assessment / Plan: 2nd stage labor, but no urge to push Will await urge to push and further descent.  Mary Espinoza 07/13/2012, 1:11 AM

## 2012-07-14 LAB — CBC
HCT: 19 % — ABNORMAL LOW (ref 36.0–46.0)
MCH: 27.5 pg (ref 26.0–34.0)
MCV: 85.6 fL (ref 78.0–100.0)
RDW: 13.5 % (ref 11.5–15.5)
WBC: 12.9 10*3/uL — ABNORMAL HIGH (ref 4.0–10.5)

## 2012-07-14 MED ORDER — OXYCODONE-ACETAMINOPHEN 5-325 MG PO TABS: 1.0000 | ORAL_TABLET | ORAL | Status: AC | PRN

## 2012-07-14 MED ORDER — IBUPROFEN 800 MG PO TABS: 800.0000 mg | ORAL_TABLET | Freq: Three times a day (TID) | ORAL | Status: AC | PRN

## 2012-07-14 NOTE — Progress Notes (Signed)
Notified Mary Espinoza, CNM of patient's critical Hemoglobin result of 6.1.  Patient states that she "feels fine", no dizziness or lightheadedness.  Completed Orthostatic blood pressures on patient and they are documented in EPIC.  No new orders given at this time; continue normal nursing care. [place], [place] M

## 2012-07-14 NOTE — Discharge Summary (Signed)
Vaginal Delivery Discharge Summary  Mary Espinoza  DOB:    August 03, 1990 MRN:    [id number] CSN:    [id number]  Date of admission:                  07/12/12  Date of discharge:                   07/14/12  Procedures this admission:  Date of Delivery: 07/13/12 NSVD by Nigel Bridgeman, CNM  Newborn Data:  Live born female  Birth Weight: 8 lb 3.2 oz (3720 g) APGAR: 9, 9  Home with mother. Name: Mary Espinoza  History of Present Illness:  Ms. Mary Espinoza is a 22 y.o. female, G1P1001, who presents at [redacted]w[redacted]d weeks gestation. The patient has been followed at the [facility] Gy[facility]cology division of [facility]. She was admitted induction of labor. Her pregnancy has been complicated by: post dates.  Hospital course:  The patient was admitted for evaluation of labor. She was induced because of post dates.  A foley balloon was used. Her labor was not complicated. She proceeded to have a vaginal delivery of a healthy infant. Her delivery was not complicated. Her postpartum course was complicated. She had a hemoglobin of 6.1. She was not orthostatic. She was discharged to home on postpartum day 1 doing well.  Feeding:  breast  Contraception:  no method  Discharge hemoglobin:  Hemoglobin  Date Value Range Status  07/14/2012 6.1* 12.0 - 15.0 g/dL Final     REPEATED TO VERIFY     DELTA CHECK NOTED     CRITICAL RESULT CALLED TO, READ BACK BY AND VERIFIED WITH:     MARISSA COX RN.@0825  ON 07/14/12 BY CALDWELL T     HCT  Date Value Range Status  07/14/2012 19.0* 36.0 - 46.0 % Final    Discharge Physical Exam:   General: no distress Lochia: appropriate Uterine Fundus: firm Incision: NA DVT Evaluation: No evidence of DVT seen on physical exam.  Transfusion offered. Declined.  Intrapartum Procedures: spontaneous vaginal delivery Postpartum Procedures: none Complications-Operative and Postpartum: 2nd degree perineal laceration  Discharge Diagnoses:  Post-date pregnancy and anemia  Discharge Information:  Activity:           pelvic rest Diet:                routine Medications: PNV, Ibuprofen, Iron and Percocet Condition:      stable and improved Instructions:  routine, anemia Discharge to: home  Follow-up Information   Follow up with [facility] In 6 weeks.   Contact information:   [address], [address] [place] [address]        Janine Limbo 07/14/2012

## 2012-07-14 NOTE — Progress Notes (Signed)
CRITICAL VALUE ALERT  Critical value received: Hemoglobin 6.1  Date of notification:  07/14/2012  Time of notification:  0830  Critical value read back:yes  Nurse who received alert:  Geronimo Running  MD notified (1st page):  Jose Persia, CNM  Time of first page:  1015  MD notified (2nd page):  Time of second page:  Responding MD:    Time MD responded:

## 2013-12-23 ENCOUNTER — Encounter (HOSPITAL_COMMUNITY): Payer: Self-pay | Admitting: *Deleted

## 2014-11-11 ENCOUNTER — Emergency Department: Payer: Medicaid Other

## 2014-11-11 ENCOUNTER — Encounter: Payer: Self-pay | Admitting: Emergency Medicine

## 2014-11-11 ENCOUNTER — Emergency Department
Admission: EM | Admit: 2014-11-11 | Discharge: 2014-11-11 | Disposition: A | Discharge status: Home / Self Care | Payer: Medicaid Other | Attending: Emergency Medicine | Admitting: Emergency Medicine

## 2014-11-11 DIAGNOSIS — Z3A09 9 weeks gestation of pregnancy: Secondary | ICD-10-CM | POA: Insufficient documentation

## 2014-11-11 DIAGNOSIS — O034 Incomplete spontaneous abortion without complication: Secondary | ICD-10-CM | POA: Diagnosis present

## 2014-11-11 DIAGNOSIS — O039 Complete or unspecified spontaneous abortion without complication: Secondary | ICD-10-CM

## 2014-11-11 LAB — COMPREHENSIVE METABOLIC PANEL
ALK PHOS: 62 U/L (ref 38–126)
ALT: 13 U/L — AB (ref 14–54)
AST: 19 U/L (ref 15–41)
Albumin: 4.1 g/dL (ref 3.5–5.0)
Anion gap: 4 — ABNORMAL LOW (ref 5–15)
BUN: 14 mg/dL (ref 6–20)
CALCIUM: 9 mg/dL (ref 8.9–10.3)
CO2: 27 mmol/L (ref 22–32)
CREATININE: 0.59 mg/dL (ref 0.44–1.00)
Chloride: 105 mmol/L (ref 101–111)
Glucose, Bld: 122 mg/dL — ABNORMAL HIGH (ref 65–99)
Potassium: 3.7 mmol/L (ref 3.5–5.1)
Sodium: 136 mmol/L (ref 135–145)
Total Bilirubin: 1 mg/dL (ref 0.3–1.2)
Total Protein: 7.2 g/dL (ref 6.5–8.1)

## 2014-11-11 LAB — CBC
HCT: 37.1 % (ref 35.0–47.0)
HEMOGLOBIN: 12.4 g/dL (ref 12.0–16.0)
MCH: 29.7 pg (ref 26.0–34.0)
MCHC: 33.5 g/dL (ref 32.0–36.0)
MCV: 88.7 fL (ref 80.0–100.0)
Platelets: 182 10*3/uL (ref 150–440)
RBC: 4.19 MIL/uL (ref 3.80–5.20)
RDW: 13 % (ref 11.5–14.5)
WBC: 10.1 10*3/uL (ref 3.6–11.0)

## 2014-11-11 LAB — ABO/RH: ABO/RH(D): O POS

## 2014-11-11 LAB — HCG, QUANTITATIVE, PREGNANCY: hCG, Beta Chain, Quant, S: 65424 m[IU]/mL — ABNORMAL HIGH (ref <5)

## 2014-11-11 MED ORDER — METHYLERGONOVINE MALEATE 0.2 MG PO TABS: 0.2000 mg | ORAL_TABLET | Freq: Four times a day (QID) | ORAL | Status: AC

## 2014-11-11 MED ORDER — METHYLERGONOVINE MALEATE 0.2 MG/ML IJ SOLN
0.2000 mg | Freq: Once | INTRAMUSCULAR | Status: AC
  Administered 2014-11-11: 0.2 mg via INTRAMUSCULAR
  Filled 2014-11-11: qty 1

## 2014-11-11 NOTE — ED Notes (Addendum)
Pt to triage via w/c with no distress noted; reports vag bleeding and abd pain since last night; st [redacted]wks pregnant, u/s indicated threatened miscarriage two wks ago; G3P2

## 2014-11-11 NOTE — ED Notes (Signed)
Pt to US at this time.

## 2014-11-11 NOTE — Consult Note (Signed)
GYNECOLOGY CONSULT NOTE  GYN Consultation  Attending Provider: Lucrezia Europe, MD  Mary Espinoza [id number] 11/11/2014 7:17 AM    Reason for Consultation:   Mary Espinoza is a 24 y.o. [id number] female seen at the request of Dr. Zenda Alpers in the Emergency Department for evaluation of incomplete spontaneous abortion.    History of Present Ilness:   She states that she is about [redacted]w[redacted]d gestation by an early ultrasound that she had during the pregnancy due to a date discrepancy.  She noticed vaginal bleeding at about 130am today.  She states that she passed the pregnancy in the waiting room of the Emergency Department this morning.  She had continued heavy bleeding that has since subsided.  She has no history of miscarriage.  She was evaluated by Dr. Zenda Alpers who found a large clot-like material in the cervical os and was not sure whether this was placenta or products of conception.  The patient denies current or recent fevers, chills, nausea, emesis.    Past Medical History  Diagnosis Date  . Back pain     due to spinal issues;has seen chiropractor   Surgical History:  History reviewed. No pertinent past surgical history.   Allergies: No Known Allergies   Medications: None  Obstetric History: She is a [id number] female s/p 2 vaginal deliveries with uncomplicated pregnancies.  She is receiving her prenatal care through a midwife and MD in [place].    Social History:  She  reports that she has never smoked. She has never used smokeless tobacco. She reports that she does not drink alcohol or use illicit drugs.  Family History:  family history includes Arthritis in her maternal grandmother and paternal grandmother; Brain cancer in her paternal uncle; Diabetes in her paternal uncle; Heart disease in her maternal grandfather; Hypertension in her father; Other in her mother; Pancreatitis in her paternal uncle; Renal cancer in her paternal grandfather.   Review of Systems:  Negative x 10 systems  reviewed except as noted in the HPI.    Objective    BP 108/64 mmHg  Pulse 80  Temp(Src) 97.8 F (36.6 C) (Oral)  Resp 18  Ht  (1.626 m)  Wt 130 lb (58.968 kg)  BMI 22.30 kg/m2  SpO2 98% Physical Exam  General:  She is a well appearing female in no distress.  HEENT:  Normocephalic, atraumatic.   Neck:  supple, no lymphadenopathy Cardiac:  Regular rate and rhythm Pulmonary:  Clear to auscultation bilaterally. No wheezes, rales, rhonchi.   Abdomen:  Soft, mildly tender to palpation in the suprapubic area, +BS, no rebound or guarding.  Pelvic:       External: normal external female genitalia     Vagina: small amounts of dark blood in the vagina.  Otherwise normal appearing with normal ruggation.      Cervix: golf-ball size amount of dark material in the external cervical os with only minimal bleeding. A pair of ring forceps are used to tease out the clot-like material until there is none visible.  The os does not completely close and a small amount is still palpable on exam, though this can not be easily reached with the ring forceps.  Bleeding is minimal to absent after removal of this clot/products of conception.     Uterus: normal and mildly tender to palpation     Adnexa:  negative for masses or fullness.  Extremities:  Non-tender, symmetric no edema bilaterally.   Neurologic:  Alert & oriented x 3.  Appropriate, conversant.  Laboratory Results:   Lab Results  Component Value Date   WBC 10.1 11/11/2014   RBC 4.19 11/11/2014   HGB 12.4 11/11/2014   HCT 37.1 11/11/2014   PLT 182 11/11/2014   NA 136 11/11/2014   K 3.7 11/11/2014   CREATININE 0.59 11/11/2014    Imaging Results: Pelvic Ultrasound Report from 11/11/14 EXAM: OBSTETRIC <14 WK [place] AND TRANSVAGINAL OB US  TECHNIQUE: Both transabdominal and transvaginal ultrasound examinations were performed for complete evaluation of the gestation as well as the maternal uterus, adnexal regions, and pelvic  cul-de-sac. Transvaginal technique was performed to assess early pregnancy.  COMPARISON: None.  FINDINGS: Intrauterine gestational sac: Not visualized.  Maternal uterus/adnexae: Thickened endometrium, measuring 2.2 cm, with associated debris/hemorrhage within the endometrial cavity extending to the cervix. No associated color Doppler flow.  Bilateral ovaries are within normal limits.  No free fluid.  IMPRESSION: No IUP is visualized.  Thickened endometrium with associated debris/hemorrhage in the endometrial cavity. No associated color Doppler flow.  Given the clinical history, this suggests abortion in progress or missed abortion. Correlate with serial beta HCG as clinically warranted.    Assessment & Recommendations  Mary Espinoza is a 24 y.o. [phone] female with an incomplete spontaneous abortion being seen in consultation.    Recommendations:  1.  Likely has passed the majority of the pregnancy with only small amounts of debris/clots remaining.   2.  Recommend discharge, if hemodynamically and otherwise clinically stable with prescription for methergine 0.2mg  PO every 6 hours for the next 24-48 hours. 3. Follow up with either me at [facility] or with her provider in [place].    Thank you for this consultation.  I am happy to participate in the care of your patient.  Conard Novak, MD 11/11/2014 7:17 AM

## 2014-11-11 NOTE — Discharge Instructions (Signed)
Miscarriage A miscarriage is the sudden loss of an unborn baby (fetus) before the 20th week of pregnancy. Most miscarriages happen in the first 3 months of pregnancy. Sometimes, it happens before a woman even knows she is pregnant. A miscarriage is also called a "spontaneous miscarriage" or "early pregnancy loss." Having a miscarriage can be an emotional experience. Talk with your caregiver about any questions you may have about miscarrying, the grieving process, and your future pregnancy plans. CAUSES   Problems with the fetal chromosomes that make it impossible for the baby to develop normally. Problems with the baby's genes or chromosomes are most often the result of errors that occur, by chance, as the embryo divides and grows. The problems are not inherited from the parents.  Infection of the cervix or uterus.   Hormone problems.   Problems with the cervix, such as having an incompetent cervix. This is when the tissue in the cervix is not strong enough to hold the pregnancy.   Problems with the uterus, such as an abnormally shaped uterus, uterine fibroids, or congenital abnormalities.   Certain medical conditions.   Smoking, drinking alcohol, or taking illegal drugs.   Trauma.  Often, the cause of a miscarriage is unknown.  SYMPTOMS   Vaginal bleeding or spotting, with or without cramps or pain.  Pain or cramping in the abdomen or lower back.  Passing fluid, tissue, or blood clots from the vagina. DIAGNOSIS  Your caregiver will perform a physical exam. You may also have an ultrasound to confirm the miscarriage. Blood or urine tests may also be ordered. TREATMENT   Sometimes, treatment is not necessary if you naturally pass all the fetal tissue that was in the uterus. If some of the fetus or placenta remains in the body (incomplete miscarriage), tissue left behind may become infected and must be removed. Usually, a dilation and curettage (D and C) procedure is performed.  During a D and C procedure, the cervix is widened (dilated) and any remaining fetal or placental tissue is gently removed from the uterus.  Antibiotic medicines are prescribed if there is an infection. Other medicines may be given to reduce the size of the uterus (contract) if there is a lot of bleeding.  If you have Rh negative blood and your baby was Rh positive, you will need a Rh immunoglobulin shot. This shot will protect any future baby from having Rh blood problems in future pregnancies. HOME CARE INSTRUCTIONS   Your caregiver may order bed rest or may allow you to continue light activity. Resume activity as directed by your caregiver.  Have someone help with home and family responsibilities during this time.   Keep track of the number of sanitary pads you use each day and how soaked (saturated) they are. Write down this information.   Do not use tampons. Do not douche or have sexual intercourse until approved by your caregiver.   Only take over-the-counter or prescription medicines for pain or discomfort as directed by your caregiver.   Do not take aspirin. Aspirin can cause bleeding.   Keep all follow-up appointments with your caregiver.   If you or your partner have problems with grieving, talk to your caregiver or seek counseling to help cope with the pregnancy loss. Allow enough time to grieve before trying to get pregnant again.  SEEK IMMEDIATE MEDICAL CARE IF:   You have severe cramps or pain in your back or abdomen.  You have a fever.  You pass large blood clots (walnut-sized   or larger) ortissue from your vagina. Save any tissue for your caregiver to inspect.   Your bleeding increases.   You have a thick, bad-smelling vaginal discharge.  You become lightheaded, weak, or you faint.   You have chills.  MAKE SURE YOU:  Understand these instructions.  Will watch your condition.  Will get help right away if you are not doing well or get  worse. Document Released: 08/03/2000 Document Revised: 06/04/2012 Document Reviewed: 03/29/2011 Exit[employer] Patient Information 2015 [employer]. This information is not intended to replace advice given to you by your health care provider. Make sure you discuss any questions you have with your health care provider.  

## 2014-11-11 NOTE — ED Provider Notes (Signed)
[facility] Emergency Department Mary Espinoza Note  ____________________________________________  Time seen: Approximately 420 AM  I have reviewed the triage vital signs and the nursing notes.   HISTORY  Chief Complaint Vaginal Bleeding    HPI Mary Espinoza is a 24 y.o. female comes in with vaginal bleeding and a possible miscarriage. The patient reports that she had some brown mucousy discharge on Saturday and Sunday. She reports that yesterday it became heavier, turned pink and then red. She reports that she developed some cramping at 3 PM and Having cramping like a period all day. She reports that at 9:30 the cramps became stronger and then became very severe. The patient reports that she spoke to her care Tatum Massman and discuss the possibilities of a miscarriage. Around 1:30 the patient had a gush and reports that it hasn't stopped bleeding since. The patient has had multiple clots and significant amounts of bleeding. She was told to come in since she had this continuous bleeding. The patient reports that in the waiting room she was soaking through pads and felt shaky and vomited. The patient reports that she then went to the bathroom and when she was using the restroom she passed the baby. She reports she still feels lots of bleeding and clots. She was 9 weeks 4 days pregnant. The patient has had an ultrasound a few weeks ago to establish pregnancy. She does have some continued cramping and reports her blood type is O+. The patient is a G3 P2 012. Since [place]   Past Medical History  Diagnosis Date  . Back pain     due to spinal issues;has seen chiropractor    Patient Active Problem List   Diagnosis Date Noted  . Vaginal delivery 07/13/2012  . Obstetric vaginal laceration, delivered, current hospitalization--2nd degree 07/13/2012  . Late prenatal care 03/02/2012    History reviewed. No pertinent past surgical history.  Current Outpatient Rx  Name  Route  Sig   Dispense  Refill  . ibuprofen (ADVIL,MOTRIN) 800 MG tablet   Oral   Take 1 tablet (800 mg total) by mouth every 8 (eight) hours as needed for pain. Patient not taking: Reported on 11/11/2014   50 tablet   1   . methylergonovine (METHERGINE) 0.2 MG tablet   Oral   Take 1 tablet (0.2 mg total) by mouth 4 (four) times daily.   8 tablet   0   . oxyCODONE-acetaminophen (ROXICET) 5-325 MG per tablet   Oral   Take 1 tablet by mouth every 4 (four) hours as needed for pain. Patient not taking: Reported on 11/11/2014   30 tablet   0     Allergies Review of patient's allergies indicates no known allergies.  Family History  Problem Relation Age of Onset  . Hypertension Father   . Diabetes Paternal Uncle   . Renal cancer Paternal Grandfather   . Other Mother     Blood Clot @ back of knee  . Arthritis Maternal Grandmother   . Arthritis Paternal Grandmother   . Heart disease Maternal Grandfather     Bypass surgery  . Brain cancer Paternal Uncle   . Pancreatitis Paternal Uncle     Social History Social History  Substance Use Topics  . Smoking status: Never Smoker   . Smokeless tobacco: Never Used  . Alcohol Use: No    Review of Systems Constitutional: No fever/chills Eyes: No visual changes. ENT: No sore throat. Cardiovascular: Denies chest pain. Respiratory: Denies shortness of breath. Gastrointestinal:  Abdominal cramping and vomiting Genitourinary: Vaginal bleeding Musculoskeletal: Negative for back pain. Skin: Negative for rash. Neurological: Negative for headaches, focal weakness or numbness.  10-point ROS otherwise negative.  ____________________________________________   PHYSICAL EXAM:  VITAL SIGNS: ED Triage Vitals  Enc Vitals Group     BP 11/11/14 0310 107/49 mmHg     Pulse Rate 11/11/14 0310 80     Resp 11/11/14 0310 18     Temp 11/11/14 0310 97.8 F (36.6 C)     Temp Source 11/11/14 0310 Oral     SpO2 11/11/14 0310 100 %     Weight 11/11/14 0310  130 lb (58.968 kg)     Height 11/11/14 0310  (1.626 m)     Head Cir --      Peak Flow --      Pain Score 11/11/14 0311 5     Pain Loc --      Pain Edu? --      Excl. in GC? --     Constitutional: Alert and oriented. Well appearing and in moderate distress. Eyes: Conjunctivae are normal. PERRL. EOMI. Head: Atraumatic. Nose: No congestion/rhinnorhea. Mouth/Throat: Mucous membranes are moist.  Oropharynx non-erythematous. Cardiovascular: Normal rate, regular rhythm. Grossly normal heart sounds.  Good peripheral circulation. Respiratory: Normal respiratory effort.  No retractions. Lungs CTAB. Gastrointestinal: Soft and nontender. No distention. Positive bowel sounds Genitourinary: Normal external genitalia, blood in vaginal vault. Cervix open with clot noticed in cervix. Musculoskeletal: No lower extremity tenderness nor edema.   Neurologic:  Normal speech and language.  Skin:  Skin is warm, dry and intact.  Psychiatric: Mood and affect are normal.  ____________________________________________   LABS (all labs ordered are listed, but only abnormal results are displayed)  Labs Reviewed  HCG, QUANTITATIVE, PREGNANCY - Abnormal; Notable for the following:    hCG, Beta Chain, Quant, S 40981 (*)    All other components within normal limits  COMPREHENSIVE METABOLIC PANEL - Abnormal; Notable for the following:    Glucose, Bld 122 (*)    ALT 13 (*)    Anion gap 4 (*)    All other components within normal limits  CBC  ABO/RH   ____________________________________________  EKG  None ____________________________________________  RADIOLOGY  Ultrasound: No IUP is visualized, thickened endometrium with associated debris, hemorrhage in the endometrial cavity, no associated color Doppler flow. Given the clinical history this suggests abortion in progress or missed abortion. ____________________________________________   PROCEDURES  Procedure(s) performed: None  Critical  Care performed: No  ____________________________________________   INITIAL IMPRESSION / ASSESSMENT AND PLAN / ED COURSE  Pertinent labs & imaging results that were available during my care of the patient were reviewed by me and considered in my medical decision making (see chart for details).  This is a 24 year old female who comes in with vaginal bleeding and a suspected miscarriage. The patient had an ultrasound ordered that did not show visualized and IUP and did pass a gestational sac. I did attempt to remove the clot from the patient's cervix but was unable to do so. I contacted OB/GYN who suggested given the patient a shot of Methergine and he would come to determine if he can also help remove the clot from the patient's cervix to help with her bleeding.  The patient was seen in the emergency department by Dr. Jean Rosenthal. He reports that he was able to remove the clot from the patient's cervix but there was a small amount still remaining in the internal cervix. He recommended  discharging the patient home as long as she was stable and having her follow-up with her midwife for her OB/GYN. The patient will be discharged home with Methergine for the next few days to help her relieve her clot. ____________________________________________   FINAL CLINICAL IMPRESSION(S) / ED DIAGNOSES  Final diagnoses:  Miscarriage      Rebecka Apley, MD 11/11/14 [phone]

## 2016-10-31 IMAGING — US US OB COMP LESS 14 WK
1 series · 14 of 28 positions shown · non-contrast
Comparison: None.

CLINICAL DATA: Pregnant, vaginal bleeding x2 days, passing large
clots

EXAM:
OBSTETRIC <14 WK US AND TRANSVAGINAL OB US
TECHNIQUE: Both transabdominal and transvaginal ultrasound examinations were
performed for complete evaluation of the gestation as well as the
maternal uterus, adnexal regions, and pelvic cul-de-sac.
Transvaginal technique was performed to assess early pregnancy.

[Series 1: us ob comp less 14 wk · 0.18mm/px · 14 of 65 slices shown]
[im 3/65]
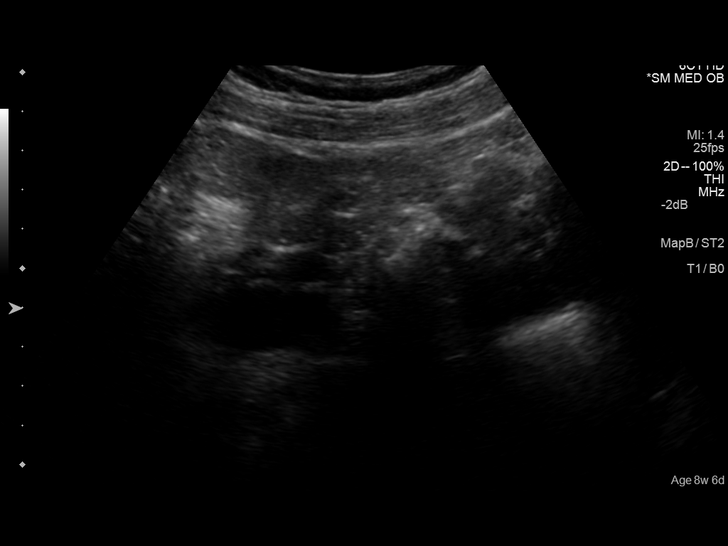
[im 8/65]
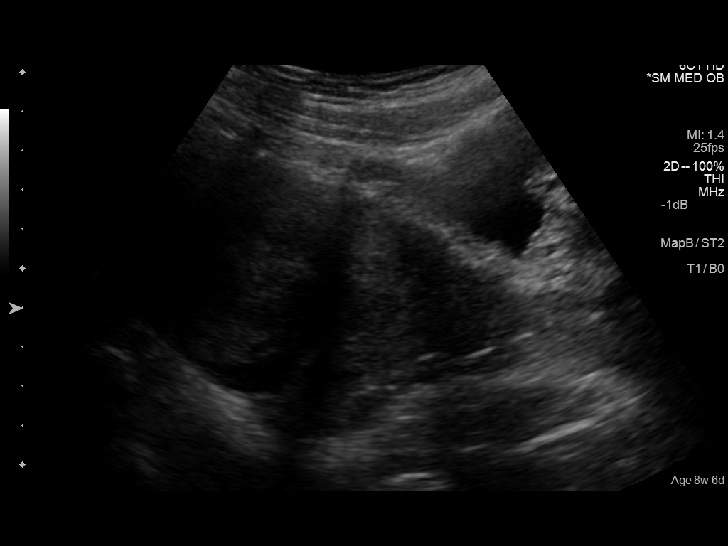
[im 12/65]
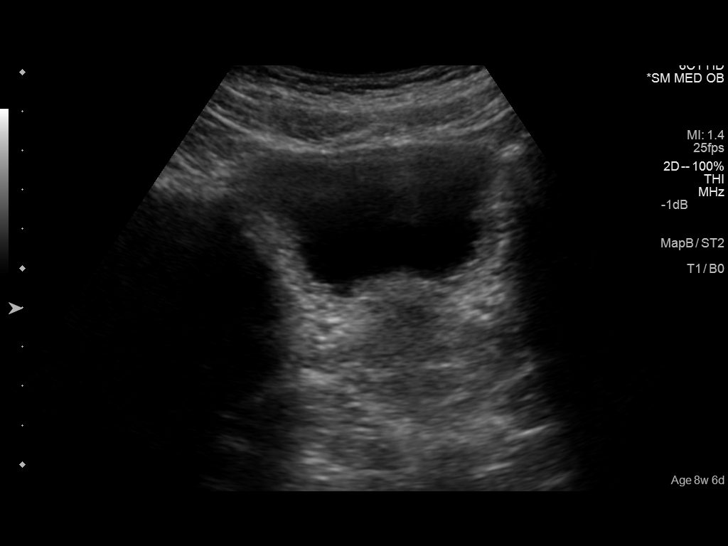
[im 17/65]
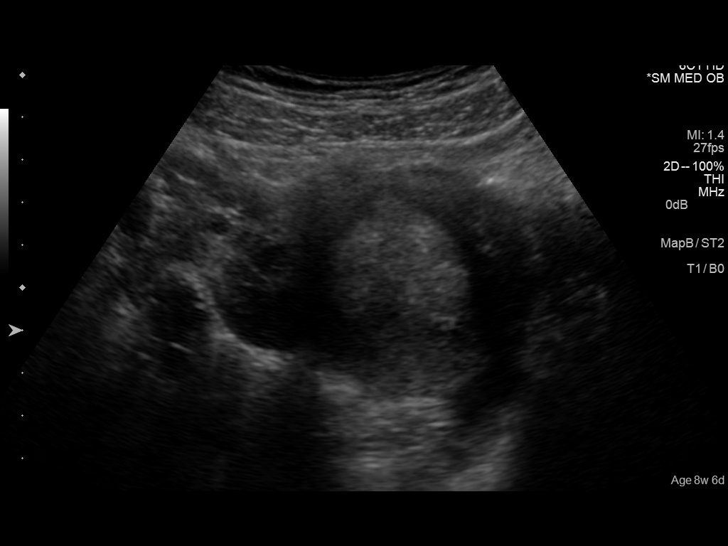
[im 22/65]
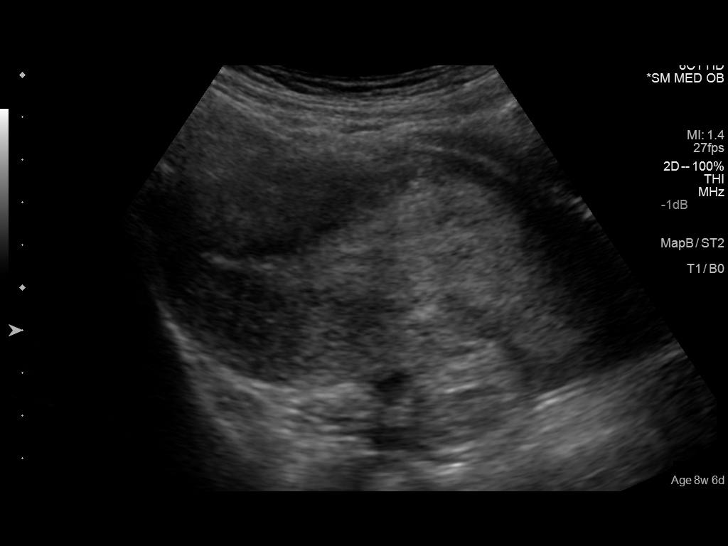
[im 27/65]
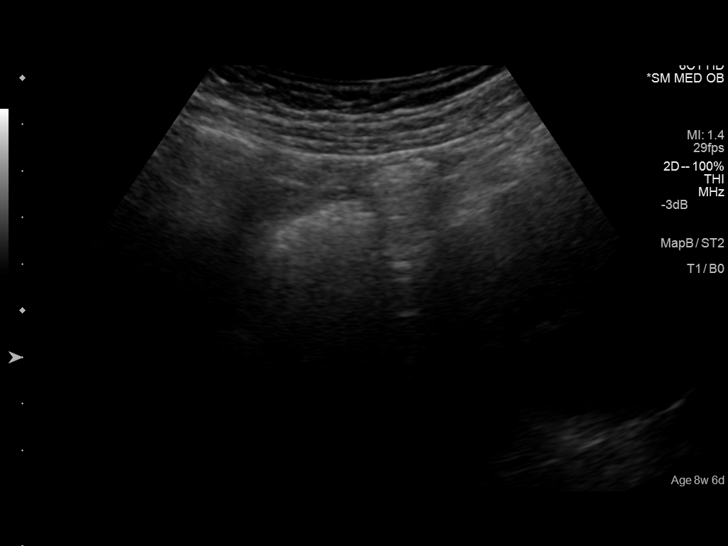
[im 31/65]
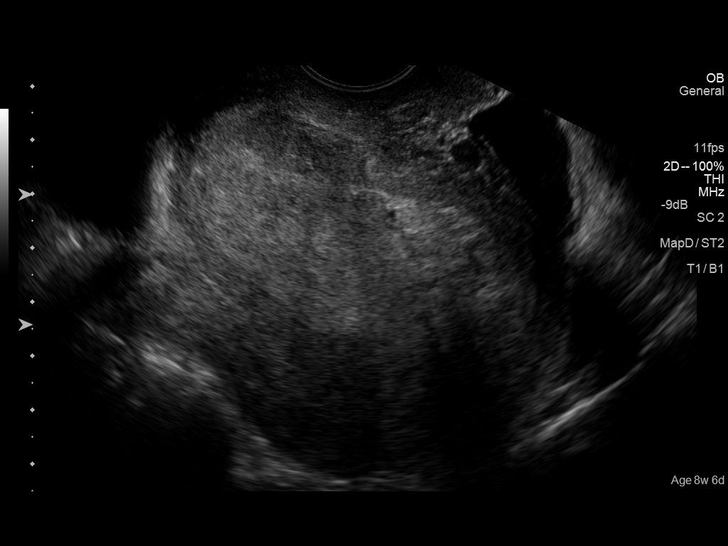
[im 36/65]
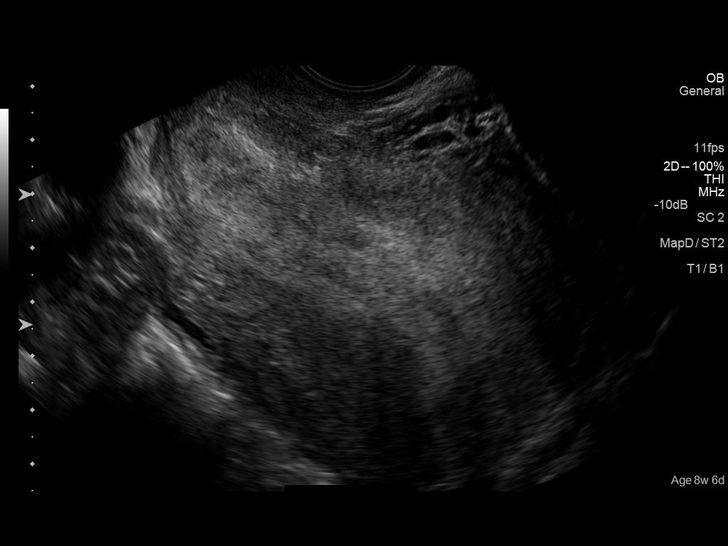
[im 41/65]
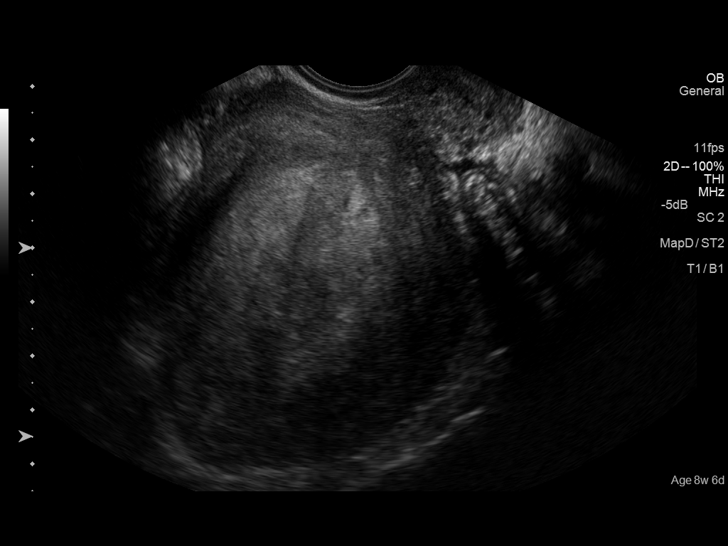
[im 46/65]
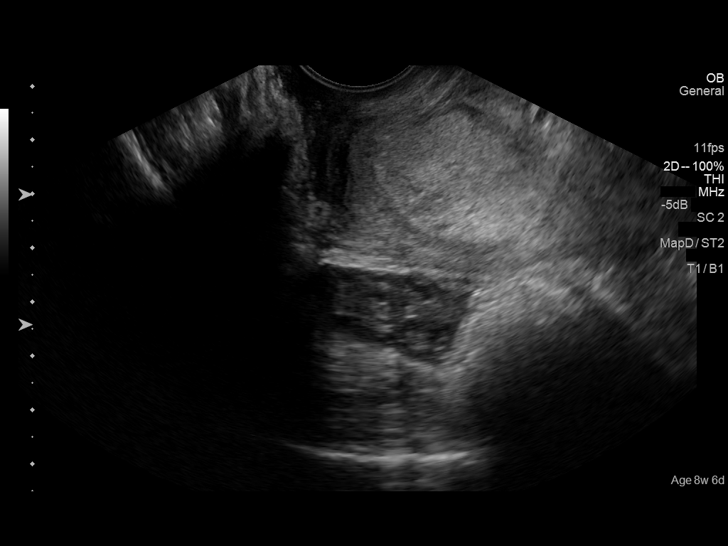
[im 50/65]
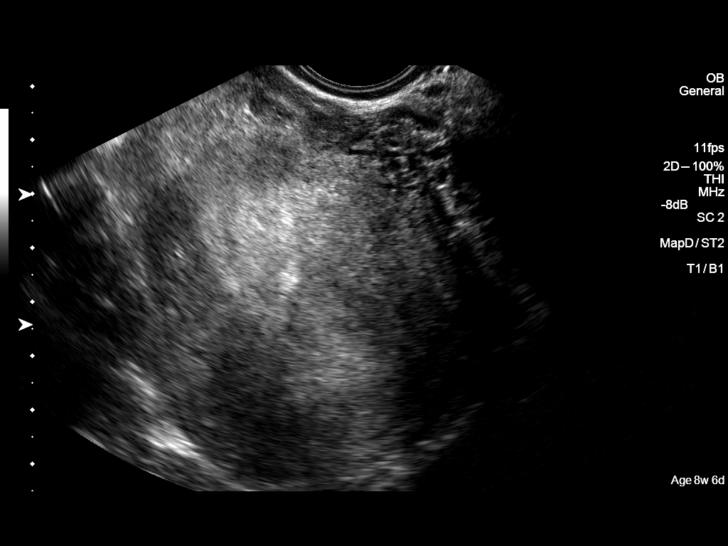
[im 55/65]
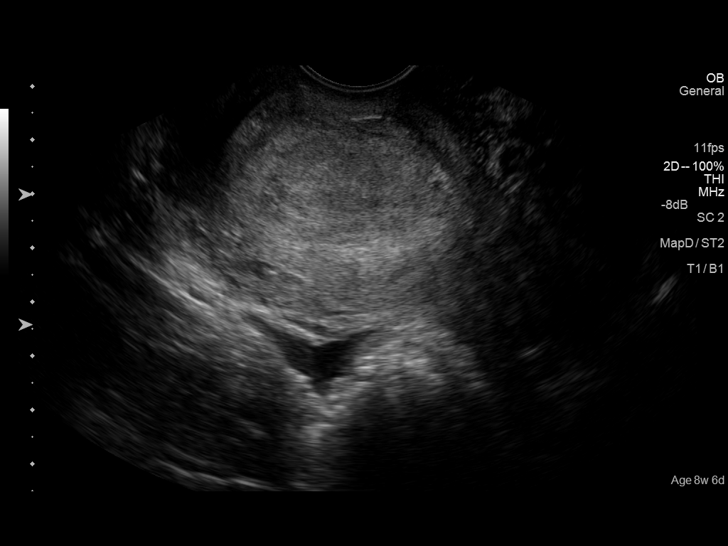
[im 60/65]
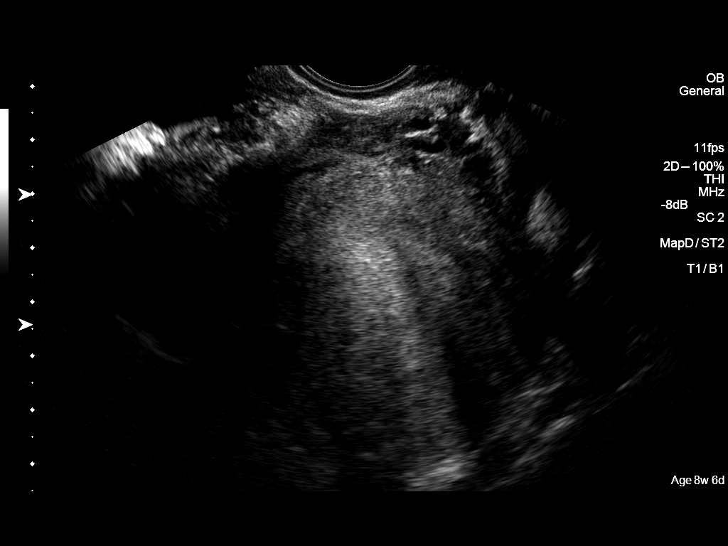
[im 65/65]
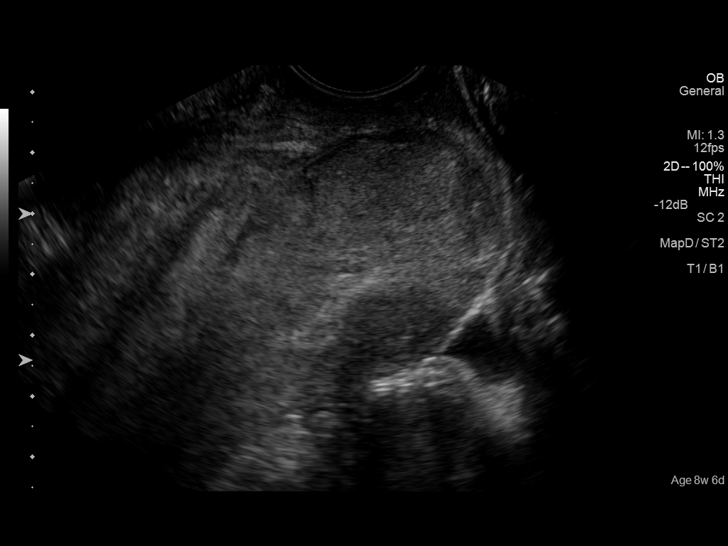

[14 of 28 positions shown; findings below may reference images not displayed]

FINDINGS: Intrauterine gestational sac: Not visualized.

Maternal uterus/adnexae: Thickened endometrium, measuring 2.2 cm,
with associated debris/hemorrhage within the endometrial cavity
extending to the cervix. No associated color Doppler flow.

Bilateral ovaries are within normal limits.

No free fluid.
IMPRESSION: No IUP is visualized.

Thickened endometrium with associated debris/hemorrhage in the
endometrial cavity. No associated color Doppler flow.

Given the clinical history, this suggests abortion in progress or
missed abortion. Correlate with serial beta HCG as clinically
warranted.
